# Patient Record
Sex: Female | Born: 1980 | Race: Black or African American | Hispanic: No | Marital: Married | Smoking: Never smoker
Health system: Southern US, Community
[De-identification: ages and names within clinical notes are randomized; demographics above are authoritative.]

## PROBLEM LIST (undated history)

## (undated) HISTORY — PX: TUBAL LIGATION: SHX77

---

## 2010-11-10 ENCOUNTER — Inpatient Hospital Stay (HOSPITAL_COMMUNITY)
Admission: AD | Admit: 2010-11-10 | Discharge: 2010-11-10 | Disposition: A | Discharge status: Home / Self Care | Payer: 59 | Source: Ambulatory Visit | Attending: Obstetrics and Gynecology | Admitting: Obstetrics and Gynecology

## 2010-11-10 DIAGNOSIS — O99891 Other specified diseases and conditions complicating pregnancy: Secondary | ICD-10-CM | POA: Insufficient documentation

## 2010-11-10 DIAGNOSIS — R109 Unspecified abdominal pain: Secondary | ICD-10-CM

## 2010-11-10 DIAGNOSIS — O9989 Other specified diseases and conditions complicating pregnancy, childbirth and the puerperium: Secondary | ICD-10-CM

## 2010-11-24 ENCOUNTER — Inpatient Hospital Stay (HOSPITAL_COMMUNITY)
Admission: AD | Admit: 2010-11-24 | Discharge: 2010-11-26 | DRG: 775 | Disposition: A | Discharge status: Home / Self Care | Payer: 59 | Source: Ambulatory Visit | Attending: Obstetrics and Gynecology | Admitting: Obstetrics and Gynecology

## 2010-11-24 LAB — CBC
HCT: 29.9 % — ABNORMAL LOW (ref 36.0–46.0)
Hemoglobin: 9.6 g/dL — ABNORMAL LOW (ref 12.0–15.0)
MCH: 22.2 pg — ABNORMAL LOW (ref 26.0–34.0)
MCHC: 32.1 g/dL (ref 30.0–36.0)
MCV: 69.1 fL — ABNORMAL LOW (ref 78.0–100.0)

## 2010-11-25 LAB — CBC
HCT: 23.6 % — ABNORMAL LOW (ref 36.0–46.0)
MCH: 21.9 pg — ABNORMAL LOW (ref 26.0–34.0)
MCHC: 31.8 g/dL (ref 30.0–36.0)
MCV: 68.8 fL — ABNORMAL LOW (ref 78.0–100.0)
RDW: 15.4 % (ref 11.5–15.5)

## 2010-12-10 ENCOUNTER — Inpatient Hospital Stay (HOSPITAL_COMMUNITY): Admission: AD | Admit: 2010-12-10 | Payer: Self-pay | Source: Home / Self Care | Admitting: Obstetrics & Gynecology

## 2017-08-17 ENCOUNTER — Encounter (HOSPITAL_BASED_OUTPATIENT_CLINIC_OR_DEPARTMENT_OTHER): Payer: Self-pay | Admitting: *Deleted

## 2017-08-17 ENCOUNTER — Emergency Department (HOSPITAL_BASED_OUTPATIENT_CLINIC_OR_DEPARTMENT_OTHER)
Admission: EM | Admit: 2017-08-17 | Discharge: 2017-08-17 | Disposition: A | Discharge status: Home / Self Care | Payer: 59 | Attending: Emergency Medicine | Admitting: Emergency Medicine

## 2017-08-17 ENCOUNTER — Emergency Department (HOSPITAL_BASED_OUTPATIENT_CLINIC_OR_DEPARTMENT_OTHER): Payer: 59

## 2017-08-17 ENCOUNTER — Other Ambulatory Visit: Payer: Self-pay

## 2017-08-17 DIAGNOSIS — M62838 Other muscle spasm: Secondary | ICD-10-CM | POA: Insufficient documentation

## 2017-08-17 DIAGNOSIS — R079 Chest pain, unspecified: Secondary | ICD-10-CM | POA: Insufficient documentation

## 2017-08-17 DIAGNOSIS — M25511 Pain in right shoulder: Secondary | ICD-10-CM | POA: Insufficient documentation

## 2017-08-17 DIAGNOSIS — R202 Paresthesia of skin: Secondary | ICD-10-CM | POA: Insufficient documentation

## 2017-08-17 LAB — BASIC METABOLIC PANEL
Anion gap: 6 (ref 5–15)
BUN: 9 mg/dL (ref 6–20)
CALCIUM: 9.1 mg/dL (ref 8.9–10.3)
CO2: 26 mmol/L (ref 22–32)
CREATININE: 0.91 mg/dL (ref 0.44–1.00)
Chloride: 103 mmol/L (ref 101–111)
Glucose, Bld: 98 mg/dL (ref 65–99)
Potassium: 4.2 mmol/L (ref 3.5–5.1)
Sodium: 135 mmol/L (ref 135–145)

## 2017-08-17 LAB — CBC
HCT: 35.5 % — ABNORMAL LOW (ref 36.0–46.0)
Hemoglobin: 11.7 g/dL — ABNORMAL LOW (ref 12.0–15.0)
MCH: 21.7 pg — ABNORMAL LOW (ref 26.0–34.0)
MCHC: 33 g/dL (ref 30.0–36.0)
MCV: 66 fL — ABNORMAL LOW (ref 78.0–100.0)
Platelets: 372 10*3/uL (ref 150–400)
RBC: 5.38 MIL/uL — ABNORMAL HIGH (ref 3.87–5.11)
RDW: 15.7 % — AB (ref 11.5–15.5)
WBC: 5.2 10*3/uL (ref 4.0–10.5)

## 2017-08-17 MED ORDER — CYCLOBENZAPRINE HCL 10 MG PO TABS: 10.0000 mg | ORAL_TABLET | Freq: Two times a day (BID) | ORAL | 0 refills | Status: AC | PRN

## 2017-08-17 MED ORDER — KETOROLAC TROMETHAMINE 30 MG/ML IJ SOLN
30.0000 mg | Freq: Once | INTRAMUSCULAR | Status: AC
  Administered 2017-08-17: 30 mg via INTRAVENOUS
  Filled 2017-08-17: qty 1

## 2017-08-17 MED ORDER — MORPHINE SULFATE (PF) 4 MG/ML IV SOLN
4.0000 mg | Freq: Once | INTRAVENOUS | Status: AC
  Administered 2017-08-17: 4 mg via INTRAVENOUS
  Filled 2017-08-17: qty 1

## 2017-08-17 MED ORDER — IOPAMIDOL (ISOVUE-370) INJECTION 76%
100.0000 mL | Freq: Once | INTRAVENOUS | Status: AC | PRN
  Administered 2017-08-17: 100 mL via INTRAVENOUS

## 2017-08-17 MED ORDER — IBUPROFEN 600 MG PO TABS: 600.0000 mg | ORAL_TABLET | Freq: Four times a day (QID) | ORAL | 0 refills | Status: AC | PRN

## 2017-08-17 MED FILL — IBUPROFEN 600 MG TABLET: 600 | 7 days supply | Qty: 30 | Fill #0

## 2017-08-17 MED FILL — CYCLOBENZAPRINE 10 MG TABLE: 10 | 10 days supply | Qty: 20 | Fill #0

## 2017-08-17 NOTE — ED Notes (Signed)
ED Provider at bedside. 

## 2017-08-17 NOTE — ED Provider Notes (Signed)
[facility] EMERGENCY DEPARTMENT Provider Note   CSN: [id number] Arrival date & time: 08/17/17  1325     History   Chief Complaint Chief Complaint  Patient presents with  . Shoulder Pain    HPI  Amber Chase is a 36 y.o. Female with a history of thalasemia, presents complaining of acute onset severe right shoulder pain. Patient reports today around 12:30 she had sharp pain in the middle of her right shoulder blade, patient reports she was sitting at her desk when this started, was not doing anything with her right arm, denies any inciting injury, no falls did not sleeps weird on the shoulder last night. Patient reports pain was so sharp and severe than above her to her knees and brought her to tears. Patient reports the pain went on for one hour without any relief, patient reports when she moves or takes a deep breath it causes the pain to shoot around to the front of her chest. Patient denies any meds prior to arrival to treat pain. Patient denies numbness, tingling or weakness in the right arm or shoulder. Denies chest pain or shortness of breath. Denies exogenous estrogen use, lower extremity pain or swelling, recent travel or immobilization, history of PE or DVT, family or personal history of bleeding or clotting disorders, cough or hemoptysis.        History reviewed. No pertinent past medical history.  There are no active problems to display for this patient.   Past Surgical History:  Procedure Laterality Date  . TUBAL LIGATION      OB History    No data available       Home Medications    Prior to Admission medications   Not on File    Family History No family history on file.  Social History Social History   Tobacco Use  . Smoking status: Never Smoker  Substance Use Topics  . Alcohol use: No    Frequency: Never  . Drug use: No     Allergies   Patient has no known allergies.   Review of Systems Review of Systems  Constitutional:  Negative for chills and fever.  HENT: Negative for congestion, rhinorrhea and sore throat.   Eyes: Negative for photophobia and visual disturbance.  Respiratory: Negative for cough, chest tightness, shortness of breath and wheezing.   Cardiovascular: Negative for chest pain, palpitations and leg swelling.  Gastrointestinal: Negative for abdominal pain, nausea and vomiting.  Genitourinary: Negative for dysuria.  Musculoskeletal: Positive for arthralgias (R shoulder). Negative for back pain, joint swelling and neck pain.  Skin: Negative for rash and wound.  Neurological: Negative for dizziness, facial asymmetry, weakness, light-headedness, numbness and headaches.     Physical Exam Updated Vital Signs BP 118/89   Pulse 83   Temp 98 F (36.7 C)   Resp 16   Ht 5' (1.524 m)   Wt 95.3 kg (210 lb)   LMP 08/03/2017   SpO2 100%   BMI 41.01 kg/m   Physical Exam  Constitutional: She appears well-developed and well-nourished. No distress.  HENT:  Head: Normocephalic and atraumatic.  Eyes: EOM are normal. Pupils are equal, round, and reactive to light. Right eye exhibits no discharge. Left eye exhibits no discharge.  Neck:  C-spine nontender to palpation at midline or paraspinally, there is tenderness over the right trapezius muscle with some tension when compared to the left, normal range of motion of the neck with some discomfort  Cardiovascular: Normal rate, regular rhythm and normal  heart sounds.  Pulmonary/Chest: Effort normal and breath sounds normal. No stridor. No respiratory distress. She has no wheezes. She has no rales. She exhibits no tenderness.  Abdominal: Soft. Bowel sounds are normal. She exhibits no distension and no mass. There is no tenderness. There is no guarding.  No right upper quadrant tenderness, negative Murphy sign  Musculoskeletal:  Tenderness to palpation over the right scapula, no swelling, erythema, warmth or palpable deformity, pain made worse with range of  motion of the right shoulder, but patient has full passive range of motion, active range of motion limited by pain, no tenderness extending down the right arm, normal range of motion of the elbow and wrist without pain, radial pulse 1+ in the right arm, on comparison left radial pulse is 2+, sensation is intact, 5/5 grip strength bilaterally  Neurological: She is alert. Coordination normal.  Speech is clear, able to follow commands CN III-XII intact Normal strength in upper and lower extremities bilaterally including dorsiflexion and plantar flexion, strong and equal grip strength Sensation normal to light and sharp touch Moves extremities without ataxia, coordination intact  Skin: Skin is warm and dry. Capillary refill takes less than 2 seconds. No rash noted. She is not diaphoretic.  Psychiatric: She has a normal mood and affect. Her behavior is normal.  Nursing note and vitals reviewed.    ED Treatments / Results  Labs (all labs ordered are listed, but only abnormal results are displayed) Labs Reviewed  CBC - Abnormal; Notable for the following components:      Result Value   RBC 5.38 (*)    Hemoglobin 11.7 (*)    HCT 35.5 (*)    MCV 66.0 (*)    MCH 21.7 (*)    RDW 15.7 (*)    All other components within normal limits  BASIC METABOLIC PANEL    EKG  EKG Interpretation  Date/Time:  Tuesday August 17 2017 16:32:50 EST Ventricular Rate:  61 PR Interval:  196 QRS Duration: 72 QT Interval:  428 QTC Calculation: 430 R Axis:   51 Text Interpretation:  Normal sinus rhythm Normal ECG No previous tracing Confirmed by Gwyneth SproutPlunkett, [place] ([id number]) on 08/17/2017 5:04:53 PM       Radiology Ct Angio Chest/abd/pel For Dissection W And/or Wo Contrast  Result Date: 08/17/2017 CLINICAL DATA:  Right shoulder, chest and back pain beginning today. Question aortic dissection. EXAM: CT ANGIOGRAPHY CHEST, ABDOMEN AND PELVIS TECHNIQUE: Multidetector CT imaging through the chest, abdomen and  pelvis was performed using the standard protocol during bolus administration of intravenous contrast. Multiplanar reconstructed images and MIPs were obtained and reviewed to evaluate the vascular anatomy. CONTRAST:  100 ml ISOVUE-370 IOPAMIDOL (ISOVUE-370) INJECTION 76% COMPARISON:  None. FINDINGS: CTA CHEST FINDINGS Cardiovascular: Preferential opacification of the thoracic aorta. No evidence of thoracic aortic aneurysm or dissection. Normal heart size. No pericardial effusion. Mediastinum/Nodes: No enlarged mediastinal, hilar, or axillary lymph nodes. Thyroid gland, trachea, and esophagus demonstrate no significant findings. Lungs/Pleura: Lungs are clear. No pleural effusion or pneumothorax. Musculoskeletal: Negative. Review of the MIP images confirms the above findings. CTA ABDOMEN AND PELVIS FINDINGS VASCULAR Aorta: Normal caliber aorta without aneurysm, dissection, vasculitis or significant stenosis. Celiac: Patent without evidence of aneurysm, dissection, vasculitis or significant stenosis. SMA: Patent without evidence of aneurysm, dissection, vasculitis or significant stenosis. Renals: Both renal arteries are patent without evidence of aneurysm, dissection, vasculitis, fibromuscular dysplasia or significant stenosis. IMA: Patent without evidence of aneurysm, dissection, vasculitis or significant stenosis. Inflow: Patent without evidence of  aneurysm, dissection, vasculitis or significant stenosis. Veins: No obvious venous abnormality within the limitations of this arterial phase study. Review of the MIP images confirms the above findings. NON-VASCULAR Hepatobiliary: The liver is diffusely low attenuating consistent with fatty infiltration. No focal lesion. Gallbladder and biliary tree appear normal. Pancreas: Unremarkable. No pancreatic ductal dilatation or surrounding inflammatory changes. Spleen: Normal in size without focal abnormality. Adrenals/Urinary Tract: The adrenals appear normal. Images prior to  contrast infusion demonstrate calcifications in the medullary pyramids bilaterally most compatible with medullary nephrocalcinosis. There is no hydronephrosis on the right or left. Urinary bladder and ureters appear normal. Stomach/Bowel: Stomach is within normal limits. Appendix appears normal. No evidence of bowel wall thickening, distention, or inflammatory changes. Lymphatic: No significant vascular findings are present. No enlarged abdominal or pelvic lymph nodes. Reproductive: Uterus and bilateral adnexa are unremarkable. Other: No ascites. Laxity of the anterior abdominal wall and superimposed fat containing umbilical hernia are seen. Musculoskeletal: Negative. Review of the MIP images confirms the above findings. IMPRESSION: Negative for aortic dissection or aneurysm. No acute abnormality chest, abdomen or pelvis. Fatty infiltration of the liver. 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 Laxity of the anterior abdominal wall a superimposed fat containing umbilical hernia. Electronically Signed   By: Drusilla Kannerhomas  Dalessio M.D.   On: 08/17/2017 16:45    Procedures Procedures (including critical care time)  Medications Ordered in ED Medications  morphine 4 MG/ML injection 4 mg (4 mg Intravenous Given 08/17/17 1626)  iopamidol (ISOVUE-370) 76 % injection 100 mL (100 mLs Intravenous Contrast Given 08/17/17 1606)  ketorolac (TORADOL) 30 MG/ML injection 30 mg (30 mg Intravenous Given 08/17/17 1703)     Initial Impression / Assessment and Plan / ED Course  I have reviewed the triage vital signs and the nursing notes.  Pertinent labs & imaging results that were available during my care of the patient were reviewed by me and considered in my medical decision making (see chart for details).  Patient presents with acute onset severe right shoulder pain, with no inciting injury, patient was sitting at her desk when pain started, which brought her to  tears. On exam vitals are normal, but patient appears uncomfortable. Tenderness over the right scapula, and on exam patient does have decreased right radial pulse when compared to the left. Initially patient denied paresthesias, but started to complain of tingling in the right arm. Given acute onset of severe pain and differential pulses, concern for acute dissection, will get labs and CT chest abdomen pelvis to rule out. Also considered DVT or PE, but patient has no history of clots, no risk factors, and is PERC negative. Patient denies chest pain or shortness of breath, EKG without acute abnormalities. We'll give morphine for pain.  CT negative for acute dissection, and shows no other acute abnormality in the chest abdomen or pelvis. Labs shows stable anemia, patient has a history of thalassemia, no leukocytosis, and electrolytes are unremarkable. On reevaluation patient reports vast improvement in pain, and has improved active range of motion of the right shoulder. Pain may be due to muscle spasm, will treat with NSAIDs and muscle relaxers, patient to follow-up with her PCP later this week. Strict return precautions with low threshold to return if patient develops chest pain shortness of breath or any other new or concerning symptoms, or pain is no longer relieved with NSAIDs. She expresses understanding and is in agreement with plan.  Patient discussed with Dr. Anitra LauthPlunkett, who saw patient as well and  agrees with plan.   Final Clinical Impressions(s) / ED Diagnoses   Final diagnoses:  Acute pain of right shoulder  Trapezius muscle spasm    ED Discharge Orders        Ordered    ibuprofen (ADVIL,MOTRIN) 600 MG tablet  Every 6 hours PRN     08/17/17 1700    cyclobenzaprine (FLEXERIL) 10 MG tablet  2 times daily PRN     08/17/17 1700       Dartha Lodge, [place] 08/18/17 [id number]    Gwyneth Sprout, MD 08/18/17 [id number]

## 2017-08-17 NOTE — Discharge Instructions (Signed)
Your evaluation today is reassuring. Please use ibuprofen and Tylenol for pain, as well as Flexeril for muscle spasm. You can do stretches and shoulder range of motion exercises. Use heat to help with pain. Please use the phone number provided at the end of your paperwork to call and schedule an appointment with a primary care provider to establish care. If you have worsening shoulder pain, or developed chest pain, shortness of breath or abdominal pain in addition to this, or have numbness, or weakness of the right arm please return to the ED for reevaluation.

## 2017-08-17 NOTE — ED Triage Notes (Addendum)
Pt c/o right shoulder pain x 1 hr w/o injury, increased pain with movt

## 2017-08-17 NOTE — ED Notes (Signed)
Patient transported to CT 

## 2018-06-30 IMAGING — CT CT ANGIO CHEST-ABD-PELV FOR DISSECTION W/ AND WO/W CM
2 of 9 series · 14 of 46 positions shown, 16 images · IV contrast (iopamidol)
Comparison: None.

CLINICAL DATA: Right shoulder, chest and back pain beginning today.
Question aortic dissection.

EXAM:
CT ANGIOGRAPHY CHEST, ABDOMEN AND PELVIS
TECHNIQUE: Multidetector CT imaging through the chest, abdomen and pelvis was
performed using the standard protocol during bolus administration of
intravenous contrast. Multiplanar reconstructed images and MIPs were
obtained and reviewed to evaluate the vascular anatomy.
CONTRAST:  100 ml T2P2HM-6WX IOPAMIDOL (T2P2HM-6WX) INJECTION 76%

[Series 5: axial arterial · axial · arterial · 0.82mm/px · z∈[-453,+96]mm · 11 of 205 slices shown, 13 images]
[im 11/205  soft-tissue]
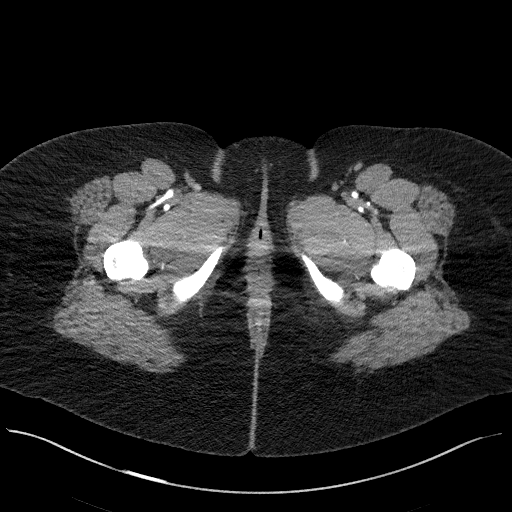
[im 11/205  bone]
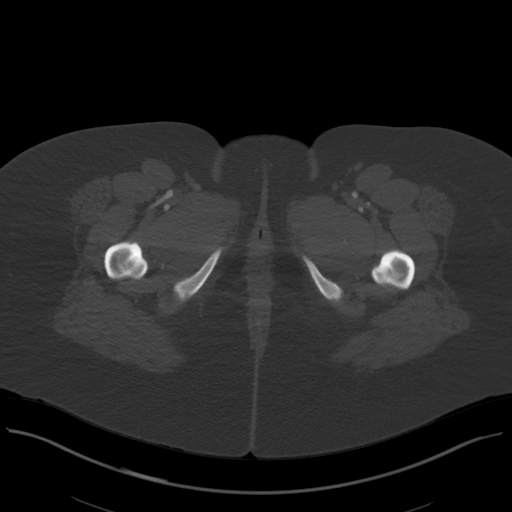
[im 33/205  soft-tissue]
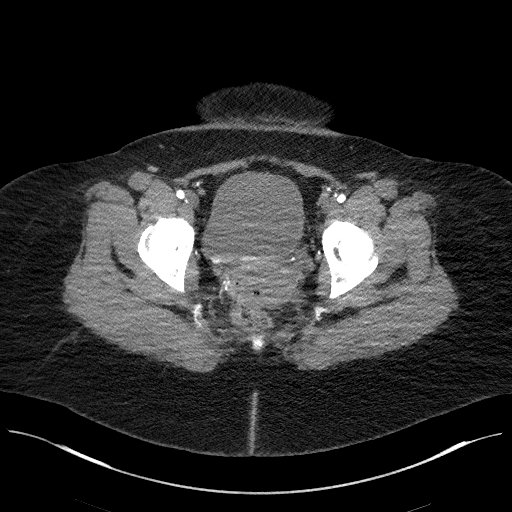
[im 54/205  soft-tissue]
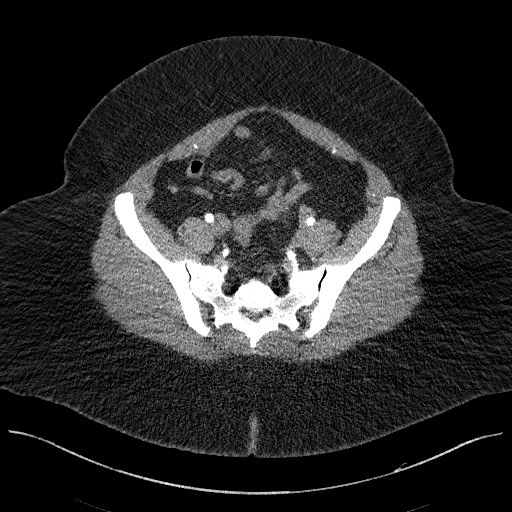
[im 65/205  soft-tissue]
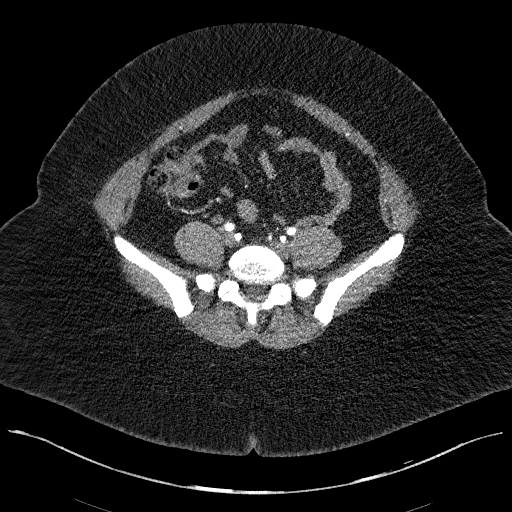
[im 86/205  soft-tissue]
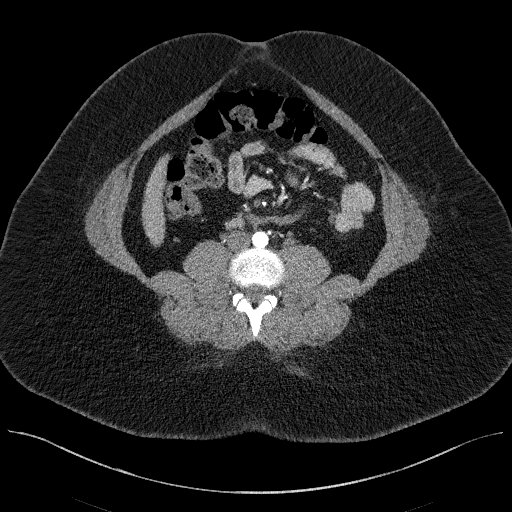
[im 108/205  soft-tissue]
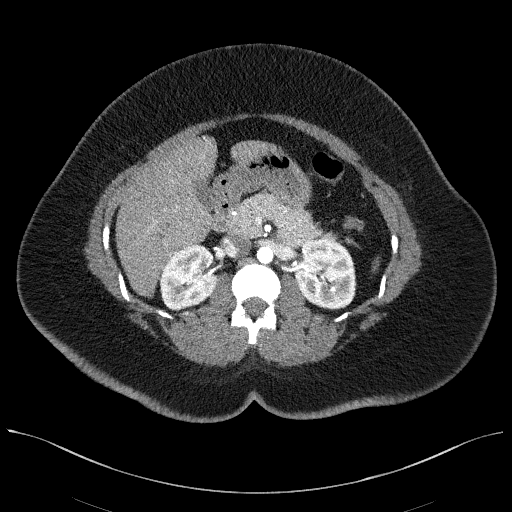
[im 119/205  soft-tissue]
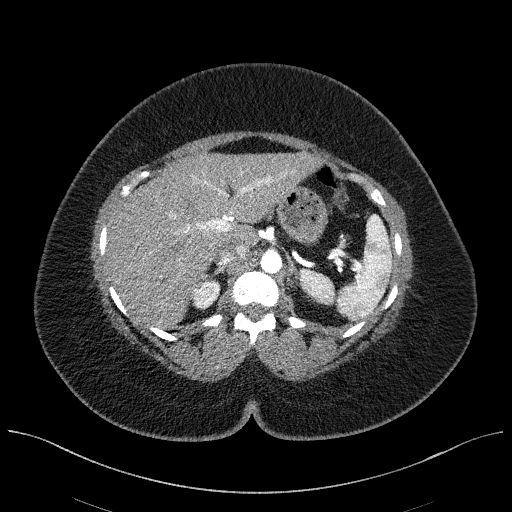
[im 140/205  soft-tissue]
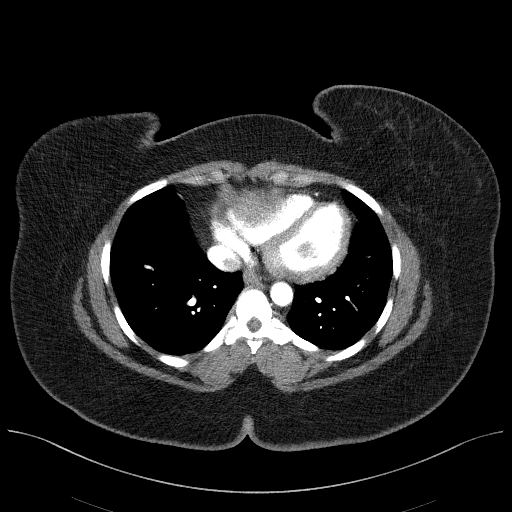
[im 151/205  soft-tissue]
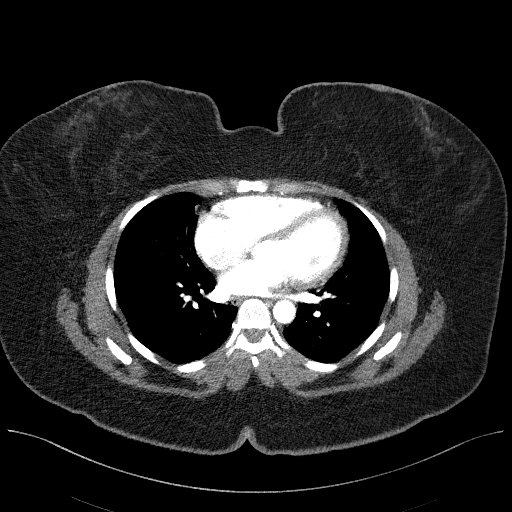
[im 151/205  bone]
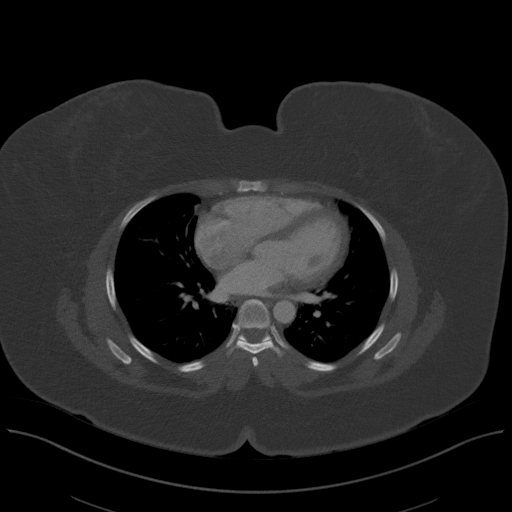
[im 172/205  soft-tissue]
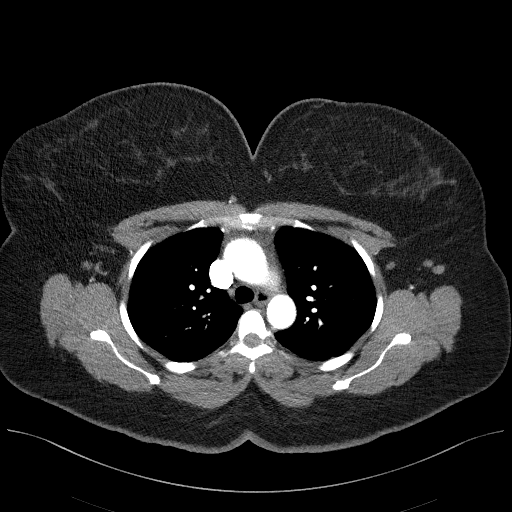
[im 194/205  soft-tissue]
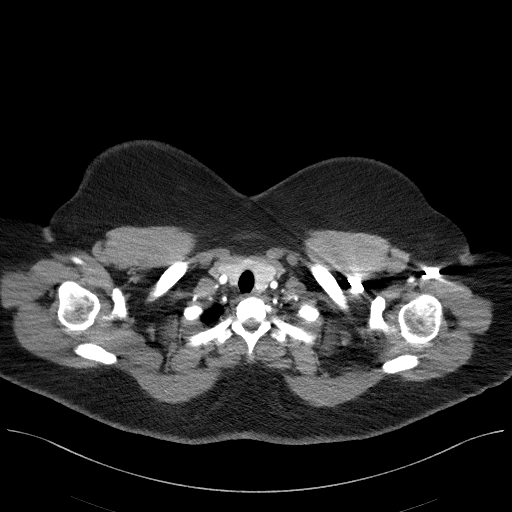

[Series 8: coronals · coronal · 0.80mm/px · 3 of 164 slices shown]
[im 41/164  soft-tissue]
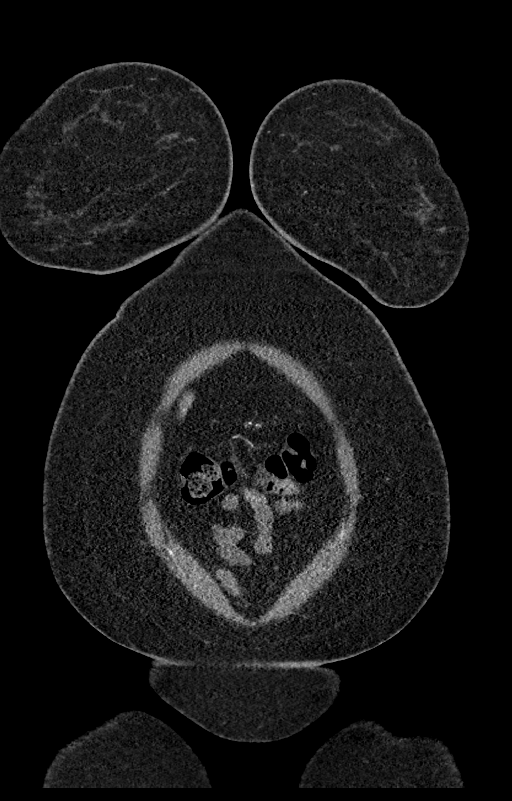
[im 82/164  soft-tissue]
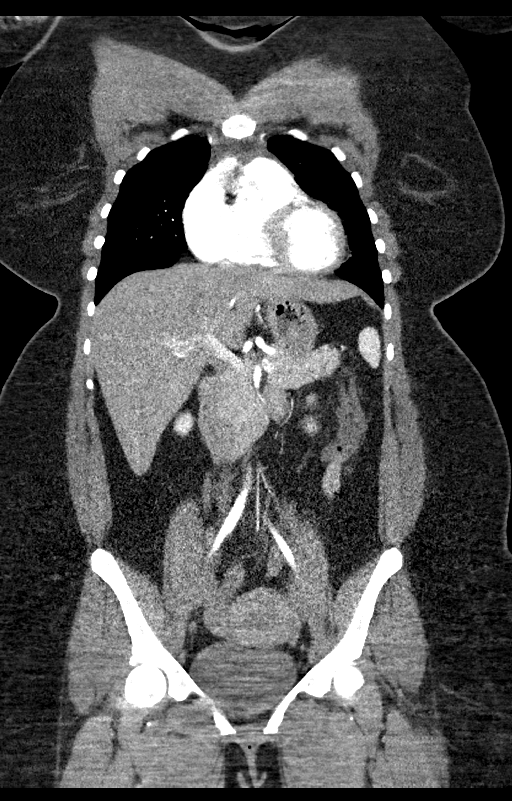
[im 123/164  soft-tissue]
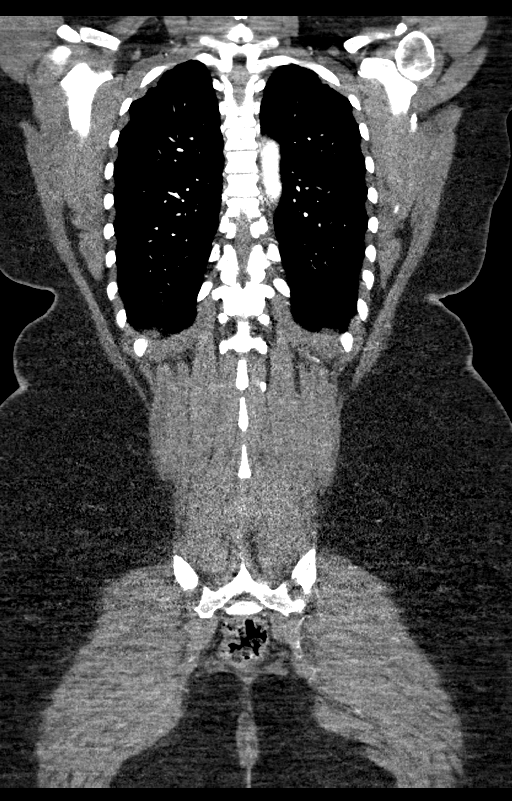

[14 of 46 positions shown; findings below may reference images not displayed]

FINDINGS: CTA CHEST FINDINGS

Cardiovascular: Preferential opacification of the thoracic aorta. No
evidence of thoracic aortic aneurysm or dissection. Normal heart
size. No pericardial effusion.

Mediastinum/Nodes: No enlarged mediastinal, hilar, or axillary lymph
nodes. Thyroid gland, trachea, and esophagus demonstrate no
significant findings.

Lungs/Pleura: Lungs are clear. No pleural effusion or pneumothorax.

Musculoskeletal: Negative.

Review of the MIP images confirms the above findings.

CTA ABDOMEN AND PELVIS FINDINGS

VASCULAR

Aorta: Normal caliber aorta without aneurysm, dissection, vasculitis
or significant stenosis.

Celiac: Patent without evidence of aneurysm, dissection, vasculitis
or significant stenosis.

SMA: Patent without evidence of aneurysm, dissection, vasculitis or
significant stenosis.

Renals: Both renal arteries are patent without evidence of aneurysm,
dissection, vasculitis, fibromuscular dysplasia or significant
stenosis.

IMA: Patent without evidence of aneurysm, dissection, vasculitis or
significant stenosis.

Inflow: Patent without evidence of aneurysm, dissection, vasculitis
or significant stenosis.

Veins: No obvious venous abnormality within the limitations of this
arterial phase study.

Review of the MIP images confirms the above findings.

NON-VASCULAR

Hepatobiliary: The liver is diffusely low attenuating consistent
with fatty infiltration. No focal lesion. Gallbladder and biliary
tree appear normal.

Pancreas: Unremarkable. No pancreatic ductal dilatation or
surrounding inflammatory changes.

Spleen: Normal in size without focal abnormality.

Adrenals/Urinary Tract: The adrenals appear normal. Images prior to
contrast infusion demonstrate calcifications in the medullary
pyramids bilaterally most compatible with medullary
nephrocalcinosis. There is no hydronephrosis on the right or left.
Urinary bladder and ureters appear normal.

Stomach/Bowel: Stomach is within normal limits. Appendix appears
normal. No evidence of bowel wall thickening, distention, or
inflammatory changes.

Lymphatic: No significant vascular findings are present. No enlarged
abdominal or pelvic lymph nodes.

Reproductive: Uterus and bilateral adnexa are unremarkable.

Other: No ascites. Laxity of the anterior abdominal wall and
superimposed fat containing umbilical hernia are seen.

Musculoskeletal: Negative.

Review of the MIP images confirms the above findings.
IMPRESSION: Negative for aortic dissection or aneurysm. No acute abnormality
chest, abdomen or pelvis.

Fatty infiltration of the liver.

Calcifications in the medullary pyramids bilaterally compatible with
medullary nephrocalcinosis.

Laxity of the anterior abdominal wall a superimposed fat containing
umbilical hernia.
